# Patient Record
Sex: Female | Born: 1965 | Race: White | Hispanic: No | Marital: Married | Smoking: Never smoker
Health system: Southern US, Community
[De-identification: ages and names within clinical notes are randomized; demographics above are authoritative.]

## PROBLEM LIST (undated history)

## (undated) DIAGNOSIS — E119 Type 2 diabetes mellitus without complications: Secondary | ICD-10-CM

## (undated) DIAGNOSIS — I10 Essential (primary) hypertension: Secondary | ICD-10-CM

## (undated) HISTORY — DX: Type 2 diabetes mellitus without complications: E11.9

---

## 2000-07-16 ENCOUNTER — Other Ambulatory Visit: Admission: RE | Admit: 2000-07-16 | Discharge: 2000-07-16 | Payer: Self-pay | Admitting: Obstetrics and Gynecology

## 2000-10-08 ENCOUNTER — Encounter: Payer: Self-pay | Admitting: *Deleted

## 2000-10-08 ENCOUNTER — Ambulatory Visit (HOSPITAL_COMMUNITY): Admission: RE | Admit: 2000-10-08 | Discharge: 2000-10-08 | Payer: Self-pay | Admitting: *Deleted

## 2000-10-11 ENCOUNTER — Encounter: Admission: RE | Admit: 2000-10-11 | Discharge: 2000-11-20 | Payer: Self-pay | Admitting: Endocrinology

## 2001-02-13 ENCOUNTER — Inpatient Hospital Stay (HOSPITAL_COMMUNITY): Admission: AD | Admit: 2001-02-13 | Discharge: 2001-02-13 | Payer: Self-pay | Admitting: Obstetrics and Gynecology

## 2001-02-16 ENCOUNTER — Inpatient Hospital Stay (HOSPITAL_COMMUNITY): Admission: AD | Admit: 2001-02-16 | Discharge: 2001-02-16 | Payer: Self-pay | Admitting: Obstetrics and Gynecology

## 2001-02-23 ENCOUNTER — Inpatient Hospital Stay (HOSPITAL_COMMUNITY): Admission: AD | Admit: 2001-02-23 | Discharge: 2001-02-26 | Payer: Self-pay | Admitting: Obstetrics & Gynecology

## 2002-04-23 HISTORY — PX: TUBAL LIGATION: SHX77

## 2005-01-11 ENCOUNTER — Ambulatory Visit: Payer: Self-pay | Admitting: Unknown Physician Specialty

## 2005-01-21 ENCOUNTER — Ambulatory Visit: Payer: Self-pay | Admitting: Unknown Physician Specialty

## 2005-09-27 ENCOUNTER — Other Ambulatory Visit: Admission: RE | Admit: 2005-09-27 | Discharge: 2005-09-27 | Payer: Self-pay | Admitting: Family Medicine

## 2009-01-26 ENCOUNTER — Ambulatory Visit: Payer: Self-pay | Admitting: Anesthesiology

## 2009-03-05 ENCOUNTER — Emergency Department: Payer: Self-pay | Admitting: Emergency Medicine

## 2010-05-10 ENCOUNTER — Ambulatory Visit: Payer: Self-pay | Admitting: Anesthesiology

## 2012-01-30 ENCOUNTER — Ambulatory Visit: Payer: Self-pay | Admitting: Family Medicine

## 2013-04-10 ENCOUNTER — Ambulatory Visit: Payer: Self-pay | Admitting: Family Medicine

## 2013-05-08 IMAGING — MG MM CAD SCREENING MAMMO
1 series · 5 of 5 positions shown · non-contrast
Comparison: none

REASON FOR EXAM: SCR MAMMO NO ORDER
COMMENTS:

PROCEDURE:     MAM - MAM DGTL SCRN MAM NO ORDER W/CAD  - January 30, 2012  [DATE]
RESULT:     Breast are dense. Benign calcification. Exam stable from prior
exams. CAD evaluation nonfocal.

[R CC · right · 5 of 5 slices shown]
[im 1/5]
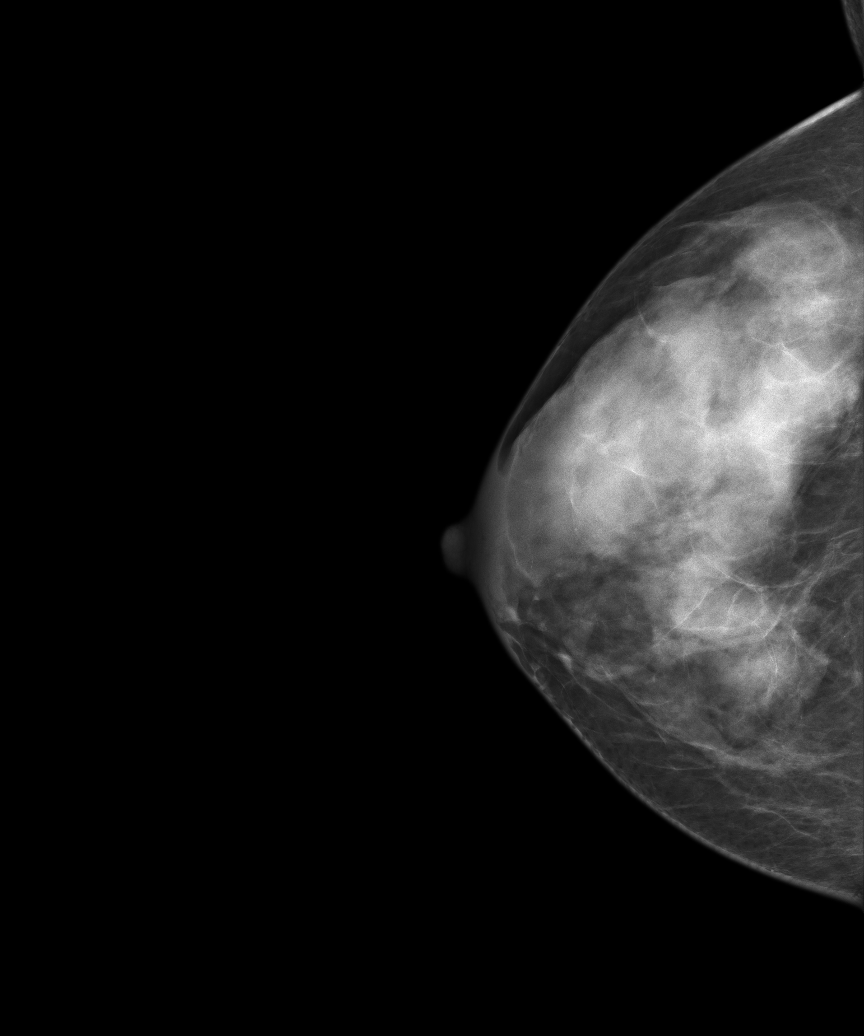
[im 2/5]
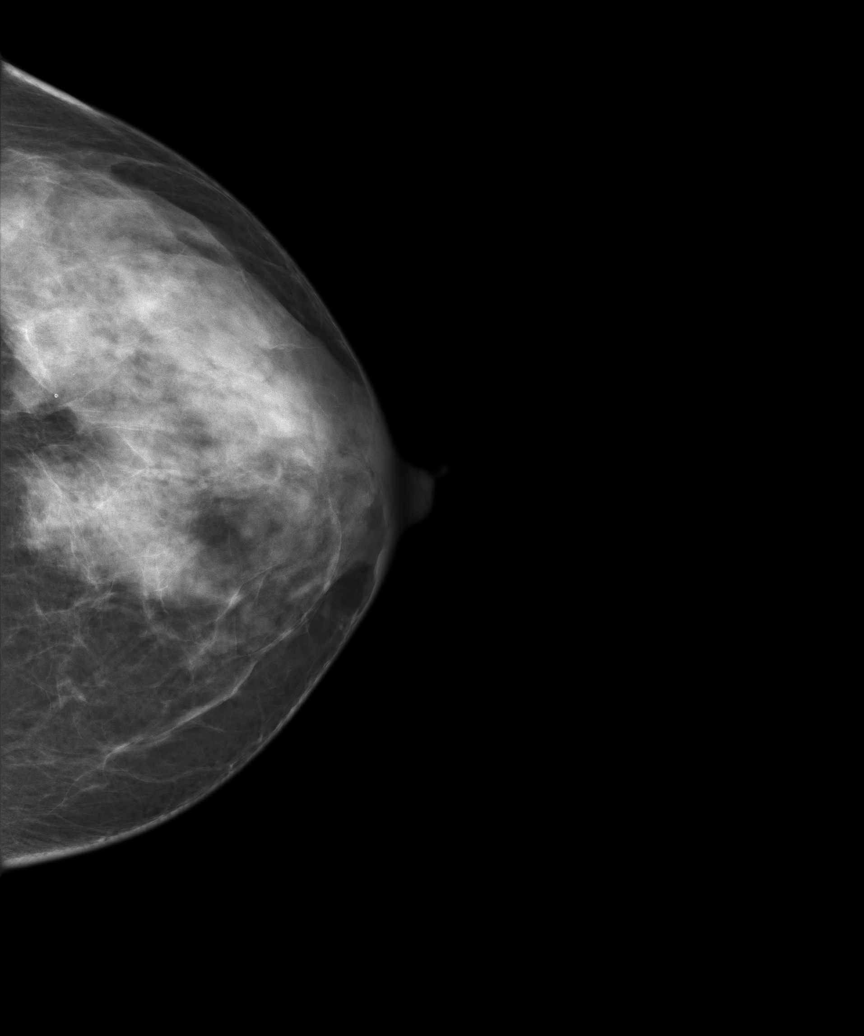
[im 3/5]
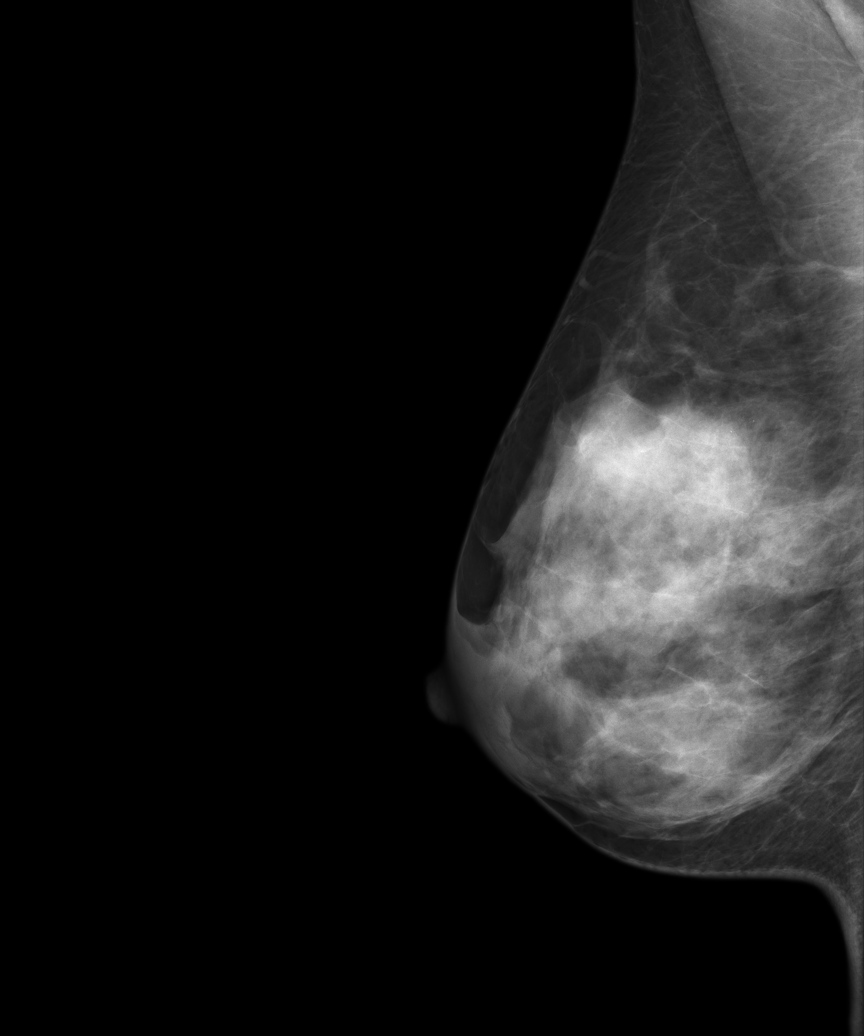
[im 4/5]
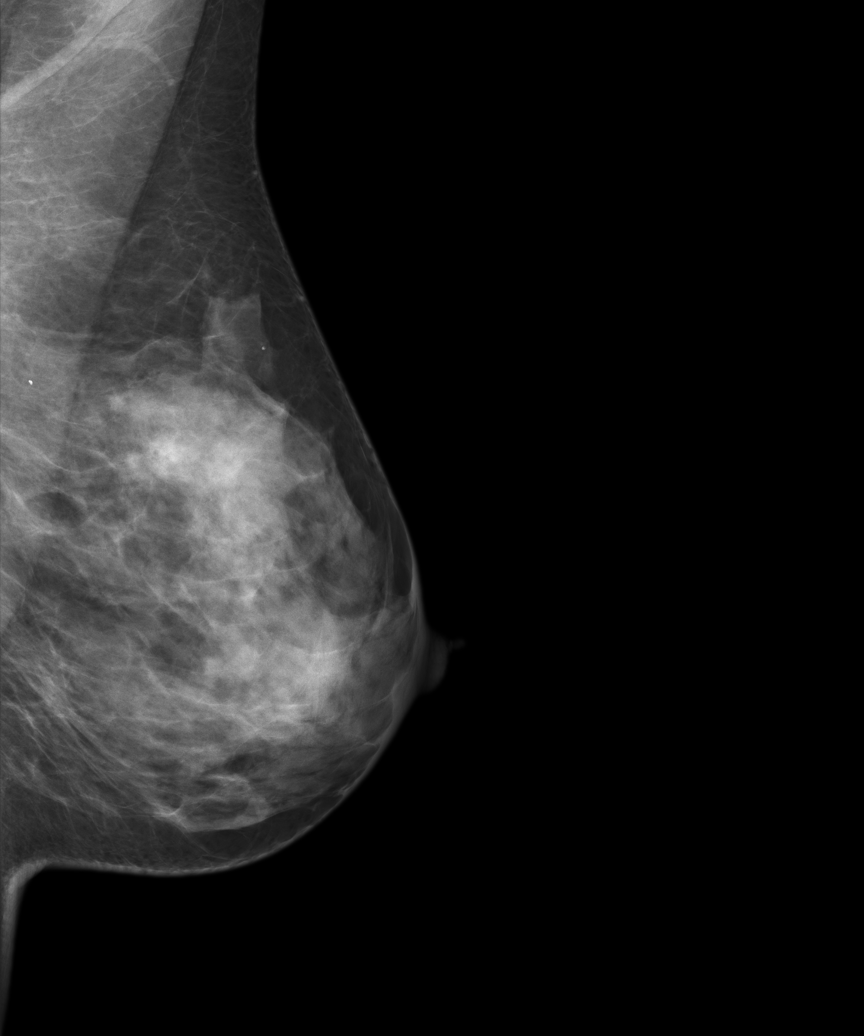
[im 5/5]
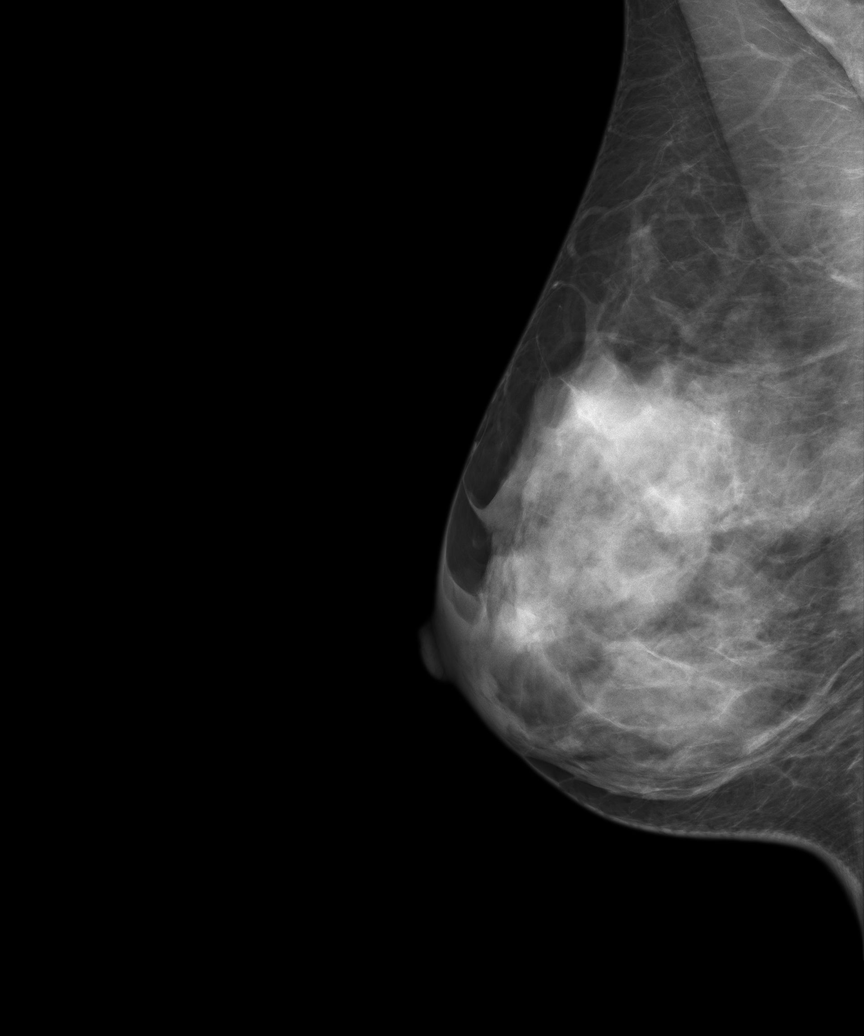

[5 of 5 positions shown; findings below may reference images not displayed]

IMPRESSION: Benign exam.

BI-RADS: Category 1 - Negative

A NEGATIVE MAMMOGRAM REPORT DOES NOT PRECLUDE BIOPSY OR OTHER EVALUATION OF
A CLINICALLY PALPABLE OR OTHERWISE SUSPICIOUS MASS OR LESION. BREAST CANCER
MAY NOT BE DETECTED IN UP TO 10% OF CASES.

## 2017-11-13 ENCOUNTER — Other Ambulatory Visit (HOSPITAL_COMMUNITY)
Admission: RE | Admit: 2017-11-13 | Discharge: 2017-11-13 | Disposition: A | Discharge status: Home / Self Care | Payer: BC Managed Care – PPO | Source: Ambulatory Visit | Attending: Obstetrics and Gynecology | Admitting: Obstetrics and Gynecology

## 2017-11-13 ENCOUNTER — Ambulatory Visit (INDEPENDENT_AMBULATORY_CARE_PROVIDER_SITE_OTHER): Payer: BC Managed Care – PPO | Admitting: Obstetrics and Gynecology

## 2017-11-13 ENCOUNTER — Encounter: Payer: Self-pay | Admitting: Obstetrics and Gynecology

## 2017-11-13 VITALS — BP 130/82 | HR 70 | Ht 63.0 in | Wt 135.0 lb

## 2017-11-13 DIAGNOSIS — Z01419 Encounter for gynecological examination (general) (routine) without abnormal findings: Secondary | ICD-10-CM | POA: Diagnosis not present

## 2017-11-13 DIAGNOSIS — Z1151 Encounter for screening for human papillomavirus (HPV): Secondary | ICD-10-CM

## 2017-11-13 DIAGNOSIS — Z1239 Encounter for other screening for malignant neoplasm of breast: Secondary | ICD-10-CM

## 2017-11-13 DIAGNOSIS — Z124 Encounter for screening for malignant neoplasm of cervix: Secondary | ICD-10-CM | POA: Insufficient documentation

## 2017-11-13 DIAGNOSIS — Z1231 Encounter for screening mammogram for malignant neoplasm of breast: Secondary | ICD-10-CM

## 2017-11-13 DIAGNOSIS — Z1211 Encounter for screening for malignant neoplasm of colon: Secondary | ICD-10-CM | POA: Diagnosis not present

## 2017-11-13 DIAGNOSIS — Z0000 Encounter for general adult medical examination without abnormal findings: Secondary | ICD-10-CM

## 2017-11-13 LAB — HEMOCCULT GUIAC POC 1CARD (OFFICE): Fecal Occult Blood, POC: NEGATIVE

## 2017-11-13 NOTE — Patient Instructions (Signed)
I value your feedback and entrusting us with your care. If you get a [facility] patient survey, I would appreciate you taking the time to let us know about your experience today. Thank you! 

## 2017-11-13 NOTE — Progress Notes (Signed)
PCP: Patient, No Pcp Per   Chief Complaint  Patient presents with  . Gynecologic Exam    HPI:      Ms. Shelly Meadows is a 52 y.o. [phone] who LMP was No LMP recorded. Patient is postmenopausal., presents today for her NP annual examination.  Her menses are absent due to menopause.  Dysmenorrhea none. She does not have intermenstrual bleeding. She does not have vasomotor sx.   Sex activity: single partner, contraception - post menopausal status. She does not have vaginal dryness.  Last Pap: not recent. No hx of abn paps.  Hx of STDs: none  Last mammogram: not recent There is no FH of breast cancer. There is no FH of ovarian cancer. The patient does do self-breast exams.  Colonoscopy:  never  Tobacco use: The patient denies current or previous tobacco use. Alcohol use: none Exercise: moderately active  She does get adequate calcium and Vitamin D in her diet.  Used to have labs with PCP and endocrine for type 1 DM. Pt stopped going to providers due to cost. Checks blood sugars regularly and uses insulin OTC from [facility]. Keeps HgA1C at 7.5% with diet changes/insulin use. Declines any screening labs.    Past Medical History:  Diagnosis Date  . Diabetes mellitus without complication [facility])     Past Surgical History:  Procedure Laterality Date  . TUBAL LIGATION  2004    Family History  Problem Relation Age of Onset  . Breast cancer Neg Hx   . Ovarian cancer Neg Hx     Social History   Socioeconomic History  . Marital status: Married    Spouse name: Not on file  . Number of children: Not on file  . Years of education: Not on file  . Highest education level: Not on file  Occupational History  . Not on file  Social Needs  . Financial resource strain: Not on file  . Food insecurity:    Worry: Not on file    Inability: Not on file  . Transportation needs:    Medical: Not on file    Non-medical: Not on file  Tobacco Use  . Smoking status: Never Smoker    . Smokeless tobacco: Never Used  Substance and Sexual Activity  . Alcohol use: Never    Frequency: Never  . Drug use: Never  . Sexual activity: Yes    Birth control/protection: None, Post-menopausal  Lifestyle  . Physical activity:    Days per week: Not on file    Minutes per session: Not on file  . Stress: Not on file  Relationships  . Social connections:    Talks on phone: Not on file    Gets together: Not on file    Attends religious service: Not on file    Active member of club or organization: Not on file    Attends meetings of clubs or organizations: Not on file    Relationship status: Not on file  . Intimate partner violence:    Fear of current or ex partner: Not on file    Emotionally abused: Not on file    Physically abused: Not on file    Forced sexual activity: Not on file  Other Topics Concern  . Not on file  Social History Narrative  . Not on file    Outpatient Medications Prior to Visit  Medication Sig Dispense Refill  . insulin NPH Human (HUMULIN N,NOVOLIN N) 100 UNIT/ML injection Inject into the skin.    Marland Kitchen  insulin regular (NOVOLIN R,HUMULIN R) 100 units/mL injection Inject into the skin 3 (three) times daily before meals.     No facility-administered medications prior to visit.     ROS:  Review of Systems  Constitutional: Negative for fatigue, fever and unexpected weight change.  Respiratory: Negative for cough, shortness of breath and wheezing.   Cardiovascular: Negative for chest pain, palpitations and leg swelling.  Gastrointestinal: Negative for blood in stool, constipation, diarrhea, nausea and vomiting.  Endocrine: Negative for cold intolerance, heat intolerance and polyuria.  Genitourinary: Negative for dyspareunia, dysuria, flank pain, frequency, genital sores, hematuria, menstrual problem, pelvic pain, urgency, vaginal bleeding, vaginal discharge and vaginal pain.  Musculoskeletal: Negative for back pain, joint swelling and myalgias.  Skin:  Negative for rash.  Neurological: Negative for dizziness, syncope, light-headedness, numbness and headaches.  Hematological: Negative for adenopathy.  Psychiatric/Behavioral: Negative for agitation, confusion, sleep disturbance and suicidal ideas. The patient is not nervous/anxious.   BREAST: No symptoms   Objective: BP 130/82   Pulse 70   Ht 5\' 3"  (1.6 m)   Wt 135 lb (61.2 kg)   BMI 23.91 kg/m    Physical Exam  Constitutional: She is oriented to person, place, and time. She appears well-developed and well-nourished.  Genitourinary: Vagina normal and uterus normal. There is no rash or tenderness on the right labia. There is no rash or tenderness on the left labia. No erythema or tenderness in the vagina. No vaginal discharge found. Right adnexum does not display mass and does not display tenderness. Left adnexum does not display mass and does not display tenderness. Cervix does not exhibit motion tenderness or polyp. Uterus is not enlarged or tender. Rectal exam shows no fissure, no tenderness and guaiac negative stool.  Neck: Normal range of motion. No thyromegaly present.  Cardiovascular: Normal rate, regular rhythm and normal heart sounds.  No murmur heard. Pulmonary/Chest: Effort normal and breath sounds normal. Right breast exhibits no mass, no nipple discharge, no skin change and no tenderness. Left breast exhibits no mass, no nipple discharge, no skin change and no tenderness.  Abdominal: Soft. There is no tenderness. There is no guarding.  Musculoskeletal: Normal range of motion.  Neurological: She is alert and oriented to person, place, and time. No cranial nerve deficit.  Psychiatric: She has a normal mood and affect. Her behavior is normal.  Vitals reviewed.   Results: Results for orders placed or performed in visit on 11/13/17 (from the past 24 hour(s))  POCT Occult Blood Stool     Status: Normal   Collection Time: 11/13/17 10:44 AM  Result Value Ref Range   Fecal  Occult Blood, POC Negative Negative   Card #1 Date     Card #2 Fecal Occult Blod, POC     Card #2 Date     Card #3 Fecal Occult Blood, POC     Card #3 Date      Assessment/Plan:  Encounter for annual routine gynecological examination  Cervical cancer screening - Plan: Cytology - PAP  Screening for HPV (human papillomavirus) - Plan: Cytology - PAP  Screening for breast cancer - Pt to sched mammo. - Plan: MM DIGITAL SCREENING BILATERAL  Screening for colon cancer - Neg FOBT. Pt declines colonscopy but is amenable to Cologuard. Ref faxed.  - Plan: POCT Occult Blood Stool, Cologuard  Blood tests for routine general physical examination - Pt declines labs.           GYN counsel breast self exam, mammography screening, menopause,  adequate intake of calcium and vitamin D, diet and exercise    F/U  Return in about 1 year (around 11/14/2018).  Sreshta Cressler B. Naydelin Ziegler, PA-C 11/13/2017 10:58 AM

## 2017-11-14 LAB — CYTOLOGY - PAP
Diagnosis: NEGATIVE
HPV: NOT DETECTED

## 2018-01-13 ENCOUNTER — Telehealth: Payer: Self-pay

## 2018-01-13 NOTE — Telephone Encounter (Signed)
Called pt and left vm to call back. [facility] wants to know if pt wants to do Cologuard test.

## 2018-11-14 NOTE — Telephone Encounter (Signed)
Per [facility], called pt to follow up on cologuard testing. Pt says shes not sure if she will do it anymore. Says she received stuff for testing and started receiving call after call and felt harrassed and irritated. Advised she is due for her annual, says she has moved and is going to look for a GYN in her area.

## 2024-03-29 ENCOUNTER — Encounter (HOSPITAL_BASED_OUTPATIENT_CLINIC_OR_DEPARTMENT_OTHER): Payer: Self-pay

## 2024-03-29 ENCOUNTER — Ambulatory Visit (HOSPITAL_BASED_OUTPATIENT_CLINIC_OR_DEPARTMENT_OTHER)
Admission: EM | Admit: 2024-03-29 | Discharge: 2024-03-29 | Disposition: A | Discharge status: Home / Self Care | Payer: Self-pay

## 2024-03-29 DIAGNOSIS — R5383 Other fatigue: Secondary | ICD-10-CM

## 2024-03-29 DIAGNOSIS — R42 Dizziness and giddiness: Secondary | ICD-10-CM

## 2024-03-29 HISTORY — DX: Essential (primary) hypertension: I10

## 2024-03-29 NOTE — ED Provider Notes (Signed)
 [facility]    CSN: [id number] Arrival date & time: 03/29/24  0910      History   Chief Complaint No chief complaint on file.   HPI Shelly Meadows is a 58 y.o. female.   Patient is a 58 year old female with past medical history of type 1 diabetes and hypertension.  She presents today with just not feeling well off and on over the past week.  Describes it as waves that come and go of dizziness, lightheadedness and heart racing.  States gets her blood work done at [facility]. Had blood drawn on Friday.  Does not know the results. Blood pressure has been elevated.  She has not been taking her blood pressure medication.  She has been drinking electrolyte fluids in case this is some sort of electrolyte imbalance.  She has had loss of appetite.  Denies any abdominal pain, nausea, vomiting, chest pain or shortness of breath.  Currently she is feeling okay.  Blood pressure elevated today. She has had some loose stools and feels like her blood sugars have been up-and-down.  Ranging between 200-300.     Past Medical History:  Diagnosis Date   Diabetes mellitus without complication (HCC)    Hypertension     There are no active problems to display for this patient.   Past Surgical History:  Procedure Laterality Date   TUBAL LIGATION  2004    OB History     Gravida  4   Para  2   Term  2   Preterm      AB  2   Living  2      SAB  2   IAB      Ectopic      Multiple      Live Births  2            Home Medications    Prior to Admission medications   Medication Sig Start Date End Date Taking? Authorizing Provider  losartan (COZAAR) 25 MG tablet Take 25 mg by mouth daily.   Yes [provider]  insulin NPH Human (HUMULIN N,NOVOLIN N) 100 UNIT/ML injection Inject into the skin.    [provider]  insulin regular (NOVOLIN R,HUMULIN R) 100 units/mL injection Inject into the skin 3 (three) times daily before meals.     [provider]    Family History Family History  Problem Relation Age of Onset   Breast cancer Neg Hx    Ovarian cancer Neg Hx     Social History Social History   Tobacco Use   Smoking status: Never   Smokeless tobacco: Never  Vaping Use   Vaping status: Never Used  Substance Use Topics   Alcohol use: Never   Drug use: Never     Allergies   Penicillins and Keflet [cephalexin]   Review of Systems Review of Systems  See HPI Physical Exam Triage Vital Signs ED Triage Vitals  Encounter Vitals Group     BP 03/29/24 0940 (!) 177/99     Girls Systolic BP Percentile --      Girls Diastolic BP Percentile --      Boys Systolic BP Percentile --      Boys Diastolic BP Percentile --      Pulse Rate 03/29/24 0940 86     Resp 03/29/24 0940 20     Temp 03/29/24 0940 98.8 F (37.1 C)     Temp Source 03/29/24 0940 Oral  SpO2 03/29/24 0940 98 %     Weight --      Height --      Head Circumference --      Peak Flow --      Pain Score 03/29/24 0941 0     Pain Loc --      Pain Education --      Exclude from Growth Chart --    No data found.  Updated Vital Signs BP (!) 177/99 (BP Location: Right Arm) Comment: 175/100 left arm  Pulse 86   Temp 98.8 F (37.1 C) (Oral)   Resp 20   SpO2 98%   Visual Acuity Right Eye Distance:   Left Eye Distance:   Bilateral Distance:    Right Eye Near:   Left Eye Near:    Bilateral Near:     Physical Exam Vitals and nursing note reviewed.  Constitutional:      General: She is not in acute distress.    Appearance: Normal appearance. She is not ill-appearing, toxic-appearing or diaphoretic.  Eyes:     Extraocular Movements: Extraocular movements intact.     Pupils: Pupils are equal, round, and reactive to light.  Cardiovascular:     Rate and Rhythm: Normal rate and regular rhythm.  Pulmonary:     Effort: Pulmonary effort is normal.     Breath sounds: Normal breath sounds.  Skin:    General: Skin is warm and  dry.  Neurological:     General: No focal deficit present.     Mental Status: She is alert.     Motor: No weakness.     Gait: Gait normal.  Psychiatric:        Mood and Affect: Mood normal.        Behavior: Behavior normal.        Thought Content: Thought content normal.        Judgment: Judgment normal.      UC Treatments / Results  Labs (all labs ordered are listed, but only abnormal results are displayed) Labs Reviewed - No data to display  EKG   Radiology No results found.  Procedures Procedures (including critical care time)  Medications Ordered in UC Medications - No data to display  Initial Impression / Assessment and Plan / UC Course  I have reviewed the triage vital signs and the nursing notes.  Pertinent labs & imaging results that were available during my care of the patient were reviewed by me and considered in my medical decision making (see chart for details).     Fatigue, dizziness, lightheadedness.  This has been ongoing for almost a week and the problem waxes and wanes.  She currently is fine sitting here today with no issues.  Patient is a type I diabetic and reports blood sugars have been around 2-300.  This is her norm.  She has had loss of appetite but has been drinking lots of electrolyte fluids to stabilize.  She had some blood work drawn and expects those results tomorrow or Tuesday at [facility].  No concerns on exam today.  My recommendations were if this problem worsens or continues she will need to go to the ER.  Also recommend starting back on her blood pressure medicine because her blood pressure was 177/99 today.  This could be contributing to her symptoms.  Recommend keep an eye on her blood sugars and blood pressures.  Patient does not currently have a primary care doctor and needs to establish.  Recommend she  come here for primary care. Final Clinical Impressions(s) / UC Diagnoses   Final diagnoses:  Other fatigue  Dizziness      Discharge Instructions      I am not sure about the calls symptoms.  This could be due to some electrolyte imbalances or blood sugar fluctuations.  I would recommend if this happens again to go to the ER or a another urgent care they can do immediate blood work and check your electrolytes.  Keep an eye on your blood sugars and keep hydrating.  Make sure you are eating good amounts of protein in your diet to regulate your blood sugars better.     ED Prescriptions   None    PDMP not reviewed this encounter.   Adah Wilbert LABOR, FNP 03/29/24 1303

## 2024-03-29 NOTE — ED Triage Notes (Signed)
 Patient has no pcp. States gets her blood work done at [facility]. Had blood drawn on Friday.  Does not know the results. Blood pressure has been elevated. Having dizziness, heart pounding. States Type 1 Diabetic. Blood sugars have been all ove the place. Patient presents wanting to get blood work with results today. No CP. No SOB. Having loose stools recently.

## 2024-03-29 NOTE — Discharge Instructions (Signed)
 I am not sure about the calls symptoms.  This could be due to some electrolyte imbalances or blood sugar fluctuations.  I would recommend if this happens again to go to the ER or a another urgent care they can do immediate blood work and check your electrolytes.  Keep an eye on your blood sugars and keep hydrating.  Make sure you are eating good amounts of protein in your diet to regulate your blood sugars better.
